# Patient Record
Sex: Male | Born: 1975
Health system: Southern US, Community
[De-identification: ages and names within clinical notes are randomized; demographics above are authoritative.]

## PROBLEM LIST (undated history)

## (undated) DIAGNOSIS — F329 Major depressive disorder, single episode, unspecified: Secondary | ICD-10-CM

## (undated) DIAGNOSIS — F32A Depression, unspecified: Secondary | ICD-10-CM

## (undated) HISTORY — DX: Depression, unspecified: F32.A

## (undated) HISTORY — DX: Major depressive disorder, single episode, unspecified: F32.9

---

## 2010-10-31 DIAGNOSIS — F411 Generalized anxiety disorder: Secondary | ICD-10-CM | POA: Insufficient documentation

## 2014-06-07 ENCOUNTER — Ambulatory Visit (INDEPENDENT_AMBULATORY_CARE_PROVIDER_SITE_OTHER): Payer: 59 | Admitting: Physician Assistant

## 2014-06-07 VITALS — BP 158/90 | HR 122 | Temp 98.6°F | Resp 20 | Ht 72.0 in | Wt 213.8 lb

## 2014-06-07 DIAGNOSIS — IMO0001 Reserved for inherently not codable concepts without codable children: Secondary | ICD-10-CM

## 2014-06-07 DIAGNOSIS — R030 Elevated blood-pressure reading, without diagnosis of hypertension: Secondary | ICD-10-CM

## 2014-06-07 DIAGNOSIS — F329 Major depressive disorder, single episode, unspecified: Secondary | ICD-10-CM

## 2014-06-07 DIAGNOSIS — F32A Depression, unspecified: Secondary | ICD-10-CM

## 2014-06-07 MED ORDER — ESCITALOPRAM OXALATE 10 MG PO TABS: 10.0000 mg | ORAL_TABLET | Freq: Every day | ORAL | Status: DC

## 2014-06-07 NOTE — Progress Notes (Signed)
   Subjective:    Patient ID: Justin Graham, male    DOB: 1975/07/28, 39 y.o.   MRN: [id number]  Chief Complaint  Patient presents with  . Establish Care   Patient Active Problem List   Diagnosis Date Noted  . Depression 06/07/2014   Medications, allergies, past medical history, surgical history, family history, social history and problem list reviewed and updated.  HPI  [age] yom with pmh depression presents here to establish care for diabetes management.   Hx depression going back 10 yrs. Had atypical cp episodes around that time and was eventually referred diagnosed with depression and referred to psych. He saw psych for several yrs for counseling and fund this helpful. They started him initially on prozac yrs ago but he had hives with this. He then tried both wellbutrin and effexor which both caused anxiety. He then was off medication for several yrs. During this time he felt back to his baseline of guilt, hopelessness, constant worry, conc issues, and lethargy. He has also experienced these sx every time he has weaned off an anti-depressant.   Prior to starting grad school 3 yrs ago he decided to start tx again. Started celexa. This has worked relatively well decreasing the above mentioned sx. Today he presents as he has run out of his medication and has moved so is no longer seeing his pcp. He works in [employer], [employer] and is concerned about the risk of qt prolongation assoc with celexa.   Denies hx SI/HI.   Review of Systems No cp, palps, fever, chills.     Objective:   Physical Exam  Constitutional: He is oriented to person, place, and time. He appears well-developed and well-nourished.  Non-toxic appearance. He does not have a sickly appearance. He does not appear ill. No distress.  BP 158/90 mmHg  Pulse 122  Temp(Src) 98.6 F (37 C) (Oral)  Resp 20  Ht 6' (1.829 m)  Wt 213 lb 12.8 oz (96.979 kg)  BMI 28.99 kg/m2  SpO2 99%   Eyes: Conjunctivae and EOM are normal.    Neurological: He is alert and oriented to person, place, and time.  Psychiatric: He has a normal mood and affect. His speech is normal and behavior is normal.      Assessment & Plan:   [age] yom with pmh depression presents here to establish care for diabetes management.   Depression --we will try lexapro today as this theoretically has less risk qt prolongation which pt is concerned over --start 10 mg qd today, he will check back in 4-6 wks with phone call or ov to assess how its working --potential to titrate up at that point  Elevated BP --pt states he has cuff at home and normally runs 130-140s/80s at home --encouraged to check 2x/week at home and record readings --rtc if running >140/90  Donnajean Lopesodd M. Falan Hensler, PA-C Physician Assistant-Certified [facility]  06/07/2014 11:46 AM

## 2014-10-31 ENCOUNTER — Telehealth: Payer: Self-pay | Admitting: Urgent Care

## 2014-10-31 DIAGNOSIS — I10 Essential (primary) hypertension: Secondary | ICD-10-CM

## 2014-10-31 MED ORDER — LISINOPRIL 5 MG PO TABS: 5.0000 mg | ORAL_TABLET | Freq: Every day | ORAL | Status: DC

## 2014-10-31 NOTE — Telephone Encounter (Signed)
Patient called regarding his blood pressure, he has been checking it over the past month and has consistently been in the 150 systolic throughout the day. Patient is asymptomatic. He uses snus and reports that this may be the cause of his symptoms. He is planning on stopping this but would like to start lis  qd. I will send script to his pharmacy and have him follow up in 1 month.

## 2014-11-21 ENCOUNTER — Other Ambulatory Visit: Payer: Self-pay | Admitting: Urgent Care

## 2014-11-21 MED ORDER — AMOXICILLIN 875 MG PO TABS: 875.0000 mg | ORAL_TABLET | Freq: Two times a day (BID) | ORAL | Status: DC

## 2015-02-15 ENCOUNTER — Other Ambulatory Visit: Payer: Self-pay | Admitting: Urgent Care

## 2015-02-15 MED ORDER — ESCITALOPRAM OXALATE 10 MG PO TABS: 10.0000 mg | ORAL_TABLET | Freq: Every day | ORAL | Status: DC

## 2015-02-15 MED ORDER — LISINOPRIL 10 MG PO TABS: 10.0000 mg | ORAL_TABLET | Freq: Every day | ORAL | Status: DC

## 2015-02-15 NOTE — Progress Notes (Signed)
Patient reports need for refills on his BP and anxiety/depression medications. He is doing well with lisinopril 10mg , lexapro 10mg . Plans on physical exam in the near future.  Wallis BambergMario Kjersti Dittmer, PA-C [facility] [phone] 02/15/2015  4:57 PM

## 2015-06-05 ENCOUNTER — Other Ambulatory Visit (INDEPENDENT_AMBULATORY_CARE_PROVIDER_SITE_OTHER): Payer: 59 | Admitting: Physician Assistant

## 2015-06-05 DIAGNOSIS — J101 Influenza due to other identified influenza virus with other respiratory manifestations: Secondary | ICD-10-CM

## 2015-06-05 LAB — POCT INFLUENZA A/B
INFLUENZA A, POC: POSITIVE — AB
INFLUENZA B, POC: NEGATIVE

## 2015-06-05 MED ORDER — OSELTAMIVIR PHOSPHATE 75 MG PO CAPS: 75.0000 mg | ORAL_CAPSULE | Freq: Two times a day (BID) | ORAL | Status: AC

## 2015-06-05 NOTE — Progress Notes (Signed)
Pt was at work with cold flu-like symptoms.  Flu test was run and it was positive.

## 2015-07-03 ENCOUNTER — Other Ambulatory Visit: Payer: Self-pay | Admitting: Physician Assistant

## 2015-09-10 ENCOUNTER — Encounter: Payer: Self-pay | Admitting: Emergency Medicine

## 2015-09-10 ENCOUNTER — Ambulatory Visit (INDEPENDENT_AMBULATORY_CARE_PROVIDER_SITE_OTHER): Payer: 59

## 2015-09-10 ENCOUNTER — Ambulatory Visit (INDEPENDENT_AMBULATORY_CARE_PROVIDER_SITE_OTHER): Payer: 59 | Admitting: Emergency Medicine

## 2015-09-10 VITALS — BP 136/84 | HR 82 | Temp 98.3°F | Resp 18 | Ht 72.0 in

## 2015-09-10 DIAGNOSIS — F329 Major depressive disorder, single episode, unspecified: Secondary | ICD-10-CM | POA: Diagnosis not present

## 2015-09-10 DIAGNOSIS — R0789 Other chest pain: Secondary | ICD-10-CM

## 2015-09-10 DIAGNOSIS — K219 Gastro-esophageal reflux disease without esophagitis: Secondary | ICD-10-CM

## 2015-09-10 DIAGNOSIS — F32A Depression, unspecified: Secondary | ICD-10-CM

## 2015-09-10 LAB — COMPLETE METABOLIC PANEL WITH GFR
ALT: 31 U/L (ref 9–46)
AST: 28 U/L (ref 10–40)
Albumin: 4.8 g/dL (ref 3.6–5.1)
Alkaline Phosphatase: 64 U/L (ref 40–115)
BILIRUBIN TOTAL: 0.5 mg/dL (ref 0.2–1.2)
BUN: 11 mg/dL (ref 7–25)
CO2: 26 mmol/L (ref 20–31)
CREATININE: 1.12 mg/dL (ref 0.60–1.35)
Calcium: 9.6 mg/dL (ref 8.6–10.3)
Chloride: 104 mmol/L (ref 98–110)
GFR, Est Non African American: 83 mL/min (ref >=60)
Glucose, Bld: 86 mg/dL (ref 65–99)
Potassium: 4.4 mmol/L (ref 3.5–5.3)
Sodium: 140 mmol/L (ref 135–146)
TOTAL PROTEIN: 7.4 g/dL (ref 6.1–8.1)

## 2015-09-10 LAB — VITAMIN B12: Vitamin B-12: 681 pg/mL (ref 200–1100)

## 2015-09-10 LAB — LIPID PANEL
CHOL/HDL RATIO: 5 ratio (ref <=5.0)
Cholesterol: 216 mg/dL — ABNORMAL HIGH (ref 125–200)
HDL: 43 mg/dL (ref >=40)
LDL CALC: 154 mg/dL — AB (ref <130)
TRIGLYCERIDES: 96 mg/dL (ref <150)
VLDL: 19 mg/dL (ref <30)

## 2015-09-10 LAB — TROPONIN I

## 2015-09-10 LAB — MAGNESIUM: Magnesium: 2.1 mg/dL (ref 1.5–2.5)

## 2015-09-10 LAB — LIPASE: LIPASE: 33 U/L (ref 7–60)

## 2015-09-10 LAB — TSH: TSH: 1.19 mIU/L (ref 0.40–4.50)

## 2015-09-10 MED ORDER — SUCRALFATE 1 GM/10ML PO SUSP: 1.0000 g | Freq: Three times a day (TID) | ORAL | Status: DC

## 2015-09-10 NOTE — Patient Instructions (Signed)
     IF you received an x-ray today, you will receive an invoice from [facility]. Please contact [facility] at [phone] with questions or concerns regarding your invoice.   IF you received labwork today, you will receive an invoice from [facility]. Please contact [facility] at [phone] with questions or concerns regarding your invoice.   Our billing staff will not be able to assist you with questions regarding bills from these companies.  You will be contacted with the lab results as soon as they are available. The fastest way to get your results is to activate your My Chart account. Instructions are located on the last page of this paperwork. If you have not heard from us regarding the results in 2 weeks, please contact this office.      

## 2015-09-10 NOTE — Progress Notes (Signed)
Patient ID: Justin Graham, male   DOB: [birth year], 40 y.o.   MRN: [id number]    By signing my name below I, Shelah LewandowskyJoseph Thomas, attest that this documentation has been prepared under the direction and in the presence of Lesle ChrisSteven Mairim Bade, MD. Electonically Signed. Shelah LewandowskyJoseph Thomas, Scribe 09/10/2015 at 2:28 PM  Chief Complaint: No chief complaint on file.   HPI: Justin Graham is a 40 y.o. male [facility] complaining of intractable heartburn for the past 4-5 days. Pt has had GERD since childhood. Pt has been on omeprazole 20mg  a day for 4 years and GERD returned if stopped taking omeprazole for 3 days. Pt quit taking omeprazole cold [place] 1.5 months ago. For the past 5 days, pt has had constant burning sensation throughout his epigastric region.  Pt denies any difficulty swallowing food or liquids. Pt denies any hematemesis, dark tarry stool, blood in stool, or changes in his BM.   Pt normally drinks 5-8 cups a coffee a day. Yesterday pt drank no coffee and pt had one cup of coffee today.   Increased stress due to newborn child.   Pt last antibiotic was amoxicillin 6 months ago. Pt denies any chronic medications other than the omeprazole.  Pt currently taking 40mg  omeprazole BID, 1gram carafate four times a day on an empty stomach, and 300mg  ranitidine. Pt states heart burn sensation persists and the carafate provides mild relief.  Past Medical History  Diagnosis Date  . Depression    No past surgical history on file. Social History   Social History  . Marital Status: Married    Spouse Name: N/A  . Number of Children: N/A  . Years of Education: N/A   Social History Main Topics  . Smoking status: Never Smoker   . Smokeless tobacco: Not on file  . Alcohol Use: 3.0 oz/week    0 Standard drinks or equivalent, 5 Cans of beer per week  . Drug Use: No  . Sexual Activity: Not on file   Other Topics Concern  . Not on file   Social History Narrative  . No narrative on file    Family History  Problem Relation Age of Onset  . Cancer Maternal Grandmother   . Stroke Maternal Grandfather   . Stroke Paternal Grandfather    Allergies  Allergen Reactions  . Prozac [Fluoxetine Hcl] Hives   Prior to Admission medications   Medication Sig Start Date End Date Taking? Authorizing Provider  amoxicillin (AMOXIL) 875 MG tablet Take 1 tablet (875 mg total) by mouth 2 (two) times daily. 11/21/14   Wallis BambergMario Mani, PA-C  escitalopram (LEXAPRO) 10 MG tablet Take 1 tablet (10 mg total) by mouth daily. 02/15/15   Wallis BambergMario Mani, PA-C  lisinopril (PRINIVIL,ZESTRIL) 10 MG tablet Take 1 tablet (10 mg total) by mouth daily. 02/15/15   Wallis BambergMario Mani, PA-C     ROS: The patient denies fevers, chills, night sweats, unintentional weight loss, chest pain, palpitations, wheezing, dyspnea on exertion, nausea, vomiting, abdominal pain, dysuria, hematuria, melena, numbness, weakness, or tingling.  All other systems have been reviewed and were otherwise negative with the exception of those mentioned in the HPI and as above.    PHYSICAL EXAM: There were no vitals filed for this visit. There is no weight on file to calculate BMI.   General: Alert, no acute distress HEENT:  Normocephalic, atraumatic, oropharynx patent. Eye: Nonie HoyerOMI, [facility] Cardiovascular:  Regular rate and rhythm, no rubs murmurs or gallops.  No Carotid bruits, radial  pulse intact. No pedal edema.  Respiratory: Clear to auscultation bilaterally.  No wheezes, rales, or rhonchi.  No cyanosis, no use of accessory musculature Abdominal: No organomegaly, abdomen is soft and non-tender, positive bowel sounds.  No masses. Musculoskeletal: Gait intact. No edema, tenderness Skin: No rashes. Neurologic: Facial musculature symmetric. Psychiatric: Patient acts appropriately throughout our interaction. Lymphatic: No cervical or submandibular lymphadenopathy   LABS:    EKG/XRAY:   Primary read interpreted by [facility]. Cleta Alberts at [facility].There are  nonspecific ST-T changes.   ASSESSMENT/PLAN: Patient has 4 days of persistent burning in his chest and upper abdomen. He has been on maximal PPI therapy with omeprazole 40 twice a day, Carafate, and ranitidine. He has had no history of exercise induced chest discomfort. Routine EKG showed inferior lateral ST depression. Labs were done including a stat troponin. Dr. Jacinto Halim has been kind enough to agree to evaluate the patient..I personally performed the services described in this documentation, which was scribed in my presence. The recorded information has been reviewed and is accurate.    Gross sideeffects, risk and benefits, and alternatives of medications d/w patient. Patient is aware that all medications have potential sideeffects and we are unable to predict every sideeffect or drug-drug interaction that may occur.  Lesle Chris MD 09/10/2015 2:12 PM

## 2015-09-11 ENCOUNTER — Other Ambulatory Visit: Payer: Self-pay | Admitting: Emergency Medicine

## 2015-09-11 ENCOUNTER — Encounter: Payer: Self-pay | Admitting: *Deleted

## 2015-09-11 LAB — VITAMIN D 25 HYDROXY (VIT D DEFICIENCY, FRACTURES): Vit D, 25-Hydroxy: 28 ng/mL — ABNORMAL LOW (ref 30–100)

## 2015-09-11 MED ORDER — ATORVASTATIN CALCIUM 40 MG PO TABS: 40.0000 mg | ORAL_TABLET | Freq: Every day | ORAL | Status: DC

## 2015-09-17 ENCOUNTER — Other Ambulatory Visit: Payer: Self-pay | Admitting: Physician Assistant

## 2015-09-17 MED ORDER — DEXLANSOPRAZOLE 60 MG PO CPDR: 60.0000 mg | DELAYED_RELEASE_CAPSULE | Freq: Every day | ORAL | Status: DC

## 2015-09-17 NOTE — Progress Notes (Signed)
Pt has been on nexium 40mg  bid for 2 weeks and he is not having resolution of heartburn symptoms.  He is able using Zantac 75mg  qid and carafate

## 2015-09-18 DIAGNOSIS — K219 Gastro-esophageal reflux disease without esophagitis: Secondary | ICD-10-CM | POA: Diagnosis not present

## 2015-09-20 DIAGNOSIS — K210 Gastro-esophageal reflux disease with esophagitis: Secondary | ICD-10-CM | POA: Diagnosis not present

## 2015-09-20 DIAGNOSIS — K2970 Gastritis, unspecified, without bleeding: Secondary | ICD-10-CM | POA: Diagnosis not present

## 2015-09-20 DIAGNOSIS — K208 Other esophagitis: Secondary | ICD-10-CM | POA: Diagnosis not present

## 2015-09-20 DIAGNOSIS — K219 Gastro-esophageal reflux disease without esophagitis: Secondary | ICD-10-CM | POA: Diagnosis not present

## 2015-09-20 DIAGNOSIS — K2940 Chronic atrophic gastritis without bleeding: Secondary | ICD-10-CM | POA: Diagnosis not present

## 2016-02-29 ENCOUNTER — Other Ambulatory Visit: Payer: Self-pay | Admitting: Urgent Care

## 2016-02-29 MED ORDER — ESCITALOPRAM OXALATE 10 MG PO TABS: 10.0000 mg | ORAL_TABLET | Freq: Every day | ORAL | 3 refills | Status: DC

## 2016-03-13 ENCOUNTER — Other Ambulatory Visit: Payer: Self-pay | Admitting: Urgent Care

## 2016-03-13 MED ORDER — OMEPRAZOLE 40 MG PO CPDR: 40.0000 mg | DELAYED_RELEASE_CAPSULE | Freq: Two times a day (BID) | ORAL | 3 refills | Status: DC

## 2016-03-27 ENCOUNTER — Telehealth: Payer: Self-pay

## 2016-03-27 NOTE — Telephone Encounter (Signed)
PA completed on covermymeds for BID dosing of omeprazole. Pending.

## 2016-03-31 NOTE — Telephone Encounter (Addendum)
PA was denied because ins will only cover #1 tab per day. [place] Its possible that [place] will cover the 80 mg of pantoprazole?

## 2016-05-10 ENCOUNTER — Telehealth: Payer: Self-pay | Admitting: Urgent Care

## 2016-05-10 MED ORDER — PANTOPRAZOLE SODIUM 40 MG PO TBEC: 40.0000 mg | DELAYED_RELEASE_TABLET | Freq: Every day | ORAL | 3 refills | Status: DC

## 2016-05-10 MED ORDER — ESCITALOPRAM OXALATE 5 MG PO TABS: 5.0000 mg | ORAL_TABLET | Freq: Every day | ORAL | 3 refills | Status: DC

## 2016-05-10 NOTE — Telephone Encounter (Signed)
Patient is doing well with GERD. Would like to start 40mg  pantoprazole. Also needs refill of Lexapro 5mg .

## 2016-10-27 ENCOUNTER — Other Ambulatory Visit: Payer: Self-pay | Admitting: Urgent Care

## 2016-10-27 MED ORDER — LISINOPRIL 10 MG PO TABS: 10.0000 mg | ORAL_TABLET | Freq: Every day | ORAL | 3 refills | Status: DC

## 2017-02-05 ENCOUNTER — Ambulatory Visit (INDEPENDENT_AMBULATORY_CARE_PROVIDER_SITE_OTHER): Payer: 59 | Admitting: Family Medicine

## 2017-02-05 DIAGNOSIS — Z23 Encounter for immunization: Secondary | ICD-10-CM | POA: Diagnosis not present

## 2017-02-06 NOTE — Progress Notes (Signed)
Nurse visit for immunization. I did not see this patient.

## 2017-02-16 ENCOUNTER — Other Ambulatory Visit: Payer: Self-pay | Admitting: Physician Assistant

## 2017-02-16 DIAGNOSIS — I10 Essential (primary) hypertension: Secondary | ICD-10-CM

## 2017-02-16 MED ORDER — LISINOPRIL 10 MG PO TABS: 10.0000 mg | ORAL_TABLET | Freq: Every day | ORAL | 3 refills | Status: DC

## 2017-02-16 NOTE — Progress Notes (Signed)
Filling to desired pharmacy.  mani prescribed.

## 2017-05-26 ENCOUNTER — Other Ambulatory Visit: Payer: Self-pay | Admitting: Urgent Care

## 2017-08-05 ENCOUNTER — Encounter: Payer: Self-pay | Admitting: Physician Assistant

## 2017-09-11 IMAGING — CR DG CHEST 2V
2 series · 2 of 2 positions shown · non-contrast
Comparison: None in PACs

CLINICAL DATA: Clinical esophagitis, gastroesophageal reflux
disease

EXAM:
CHEST  2 VIEW

[PA]
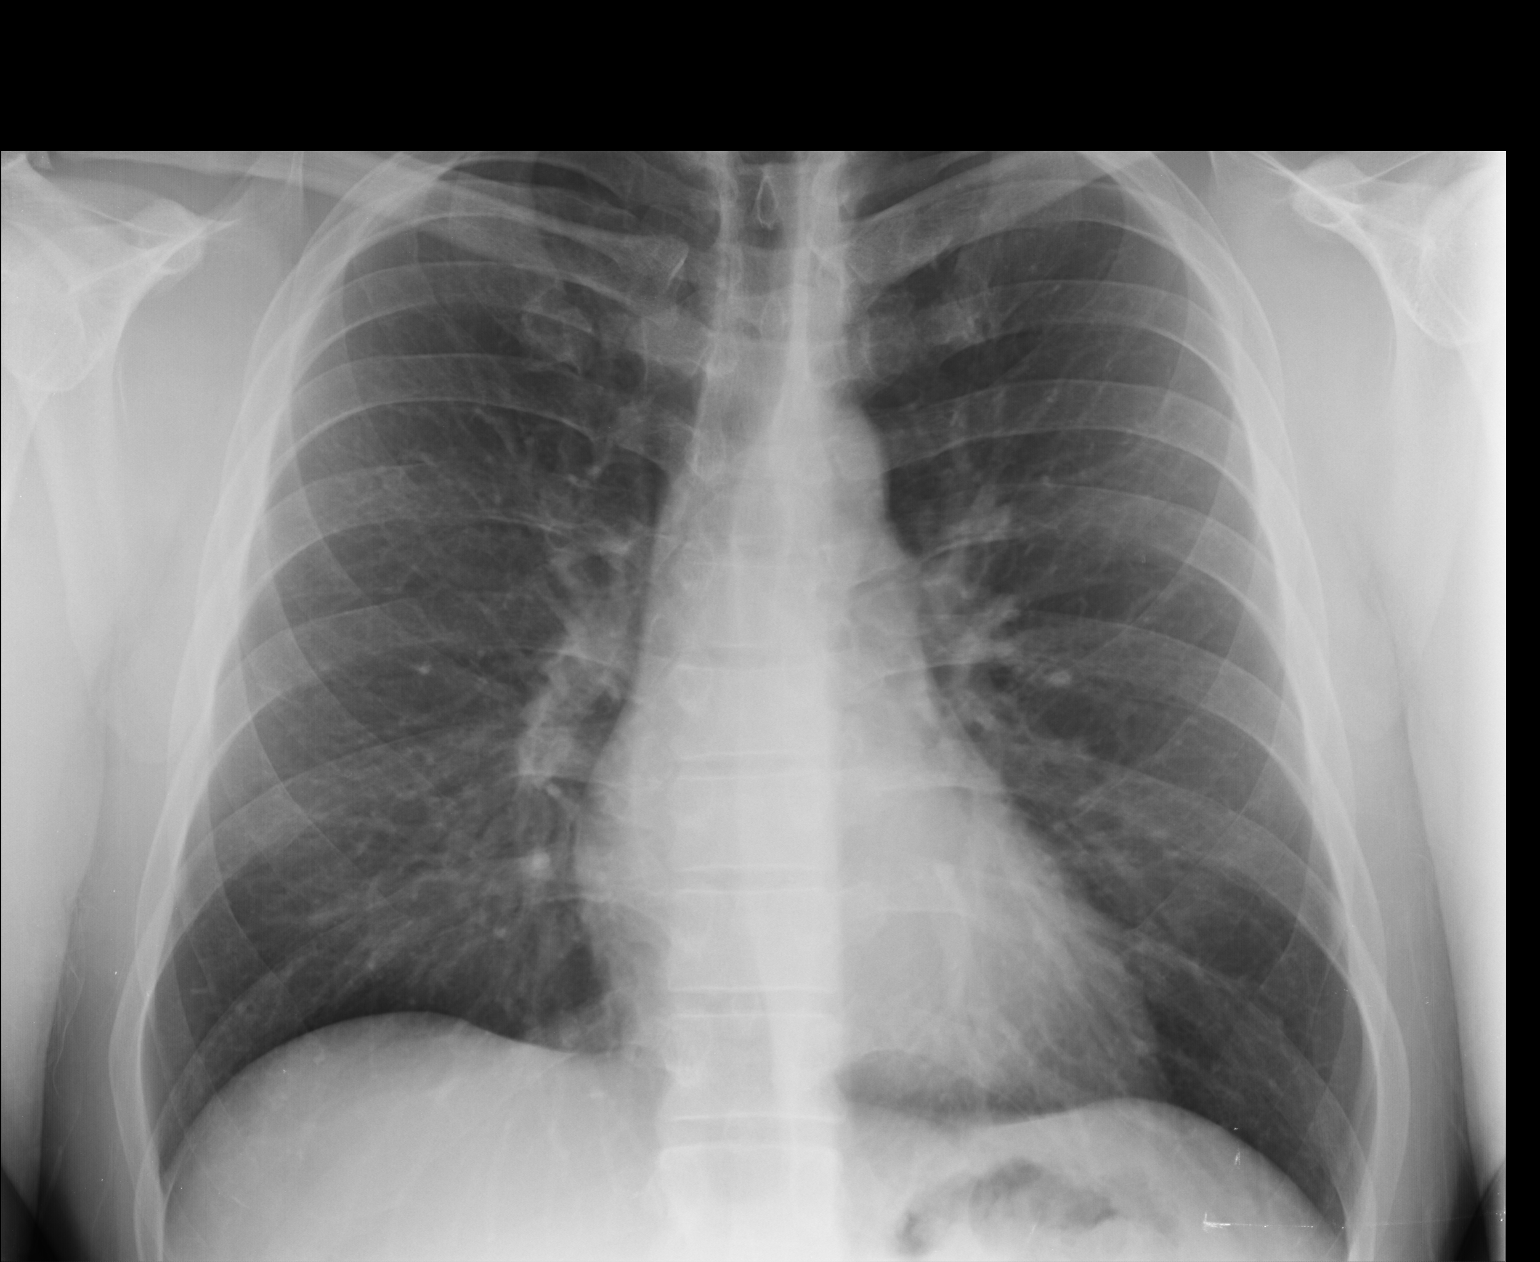

[lateral]
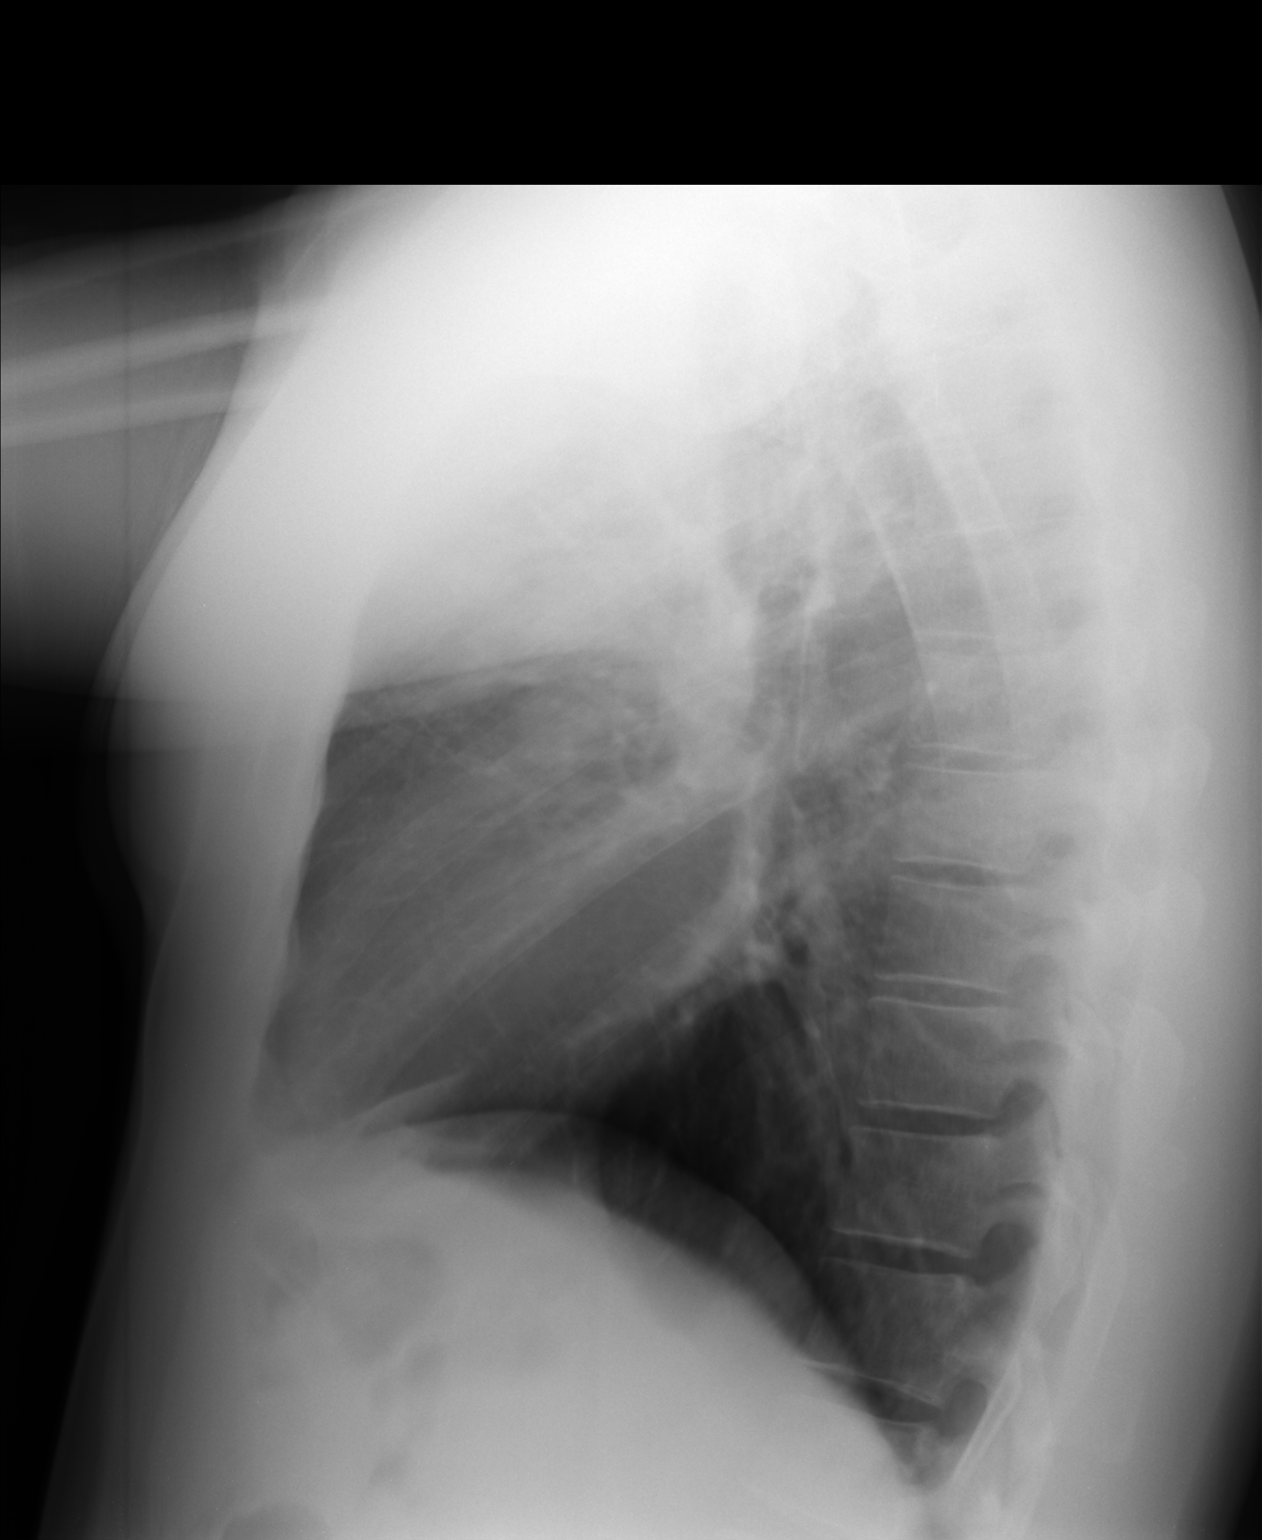

[2 of 2 positions shown; findings below may reference images not displayed]

FINDINGS: The lungs are well-expanded and clear. The heart and mediastinal
structures are normal. There is no pulmonary vascular congestion.
There is no pleural effusion or pneumothorax. The trachea is
midline. The bony thorax is unremarkable.
IMPRESSION: There is no active cardiopulmonary disease. The mediastinal
structures appear normal.

## 2017-11-04 ENCOUNTER — Ambulatory Visit: Payer: 59 | Admitting: Physician Assistant

## 2017-11-04 ENCOUNTER — Other Ambulatory Visit: Payer: Self-pay | Admitting: Physician Assistant

## 2017-11-04 DIAGNOSIS — A184 Tuberculosis of skin and subcutaneous tissue: Secondary | ICD-10-CM

## 2017-11-04 DIAGNOSIS — Z111 Encounter for screening for respiratory tuberculosis: Secondary | ICD-10-CM

## 2017-11-04 NOTE — Progress Notes (Signed)
I did not see this pt. Here for PPD only.

## 2017-11-13 LAB — TB SKIN TEST
Induration: 0 mm
TB Skin Test: NEGATIVE

## 2017-11-17 ENCOUNTER — Encounter: Payer: Self-pay | Admitting: Physician Assistant

## 2017-11-17 ENCOUNTER — Ambulatory Visit (INDEPENDENT_AMBULATORY_CARE_PROVIDER_SITE_OTHER): Payer: 59

## 2017-11-17 ENCOUNTER — Other Ambulatory Visit: Payer: Self-pay

## 2017-11-17 ENCOUNTER — Ambulatory Visit (INDEPENDENT_AMBULATORY_CARE_PROVIDER_SITE_OTHER): Payer: 59 | Admitting: Physician Assistant

## 2017-11-17 VITALS — BP 135/85 | HR 119 | Temp 97.9°F | Resp 18 | Ht 72.0 in | Wt 195.0 lb

## 2017-11-17 DIAGNOSIS — R079 Chest pain, unspecified: Secondary | ICD-10-CM | POA: Diagnosis not present

## 2017-11-17 DIAGNOSIS — I10 Essential (primary) hypertension: Secondary | ICD-10-CM | POA: Diagnosis not present

## 2017-11-17 DIAGNOSIS — F411 Generalized anxiety disorder: Secondary | ICD-10-CM

## 2017-11-17 DIAGNOSIS — E7800 Pure hypercholesterolemia, unspecified: Secondary | ICD-10-CM

## 2017-11-17 DIAGNOSIS — Z23 Encounter for immunization: Secondary | ICD-10-CM

## 2017-11-17 MED ORDER — LISINOPRIL 20 MG PO TABS: 20.0000 mg | ORAL_TABLET | Freq: Every day | ORAL | 1 refills | Status: AC

## 2017-11-17 MED ORDER — BUSPIRONE HCL 10 MG PO TABS: 10.0000 mg | ORAL_TABLET | Freq: Three times a day (TID) | ORAL | 0 refills | Status: DC

## 2017-11-17 MED ORDER — PROPRANOLOL HCL ER 60 MG PO CP24: 60.0000 mg | ORAL_CAPSULE | Freq: Every day | ORAL | 1 refills | Status: DC

## 2017-11-17 NOTE — Progress Notes (Signed)
Justin Graham  MRN: [id number] DOB: 1975/08/10  PCP: Patient, No Pcp Per  Chief Complaint  Patient presents with  . Chest Pain    Subjective:  Pt presents to clinic for concerns regarding chest pain.  This started about 4 days ago it is an aching sensation on his left sternal border.  Seems positional to patient.  Exercise does make the pain get better.  He is having no associated nausea dizziness sweating.  He is worried due to his elevated cholesterol without treatment as well as his diagnosis of hypertension though home readings show under 120/80 most of the time.  He is currently very stressed out and feels very fatigued.  He has had problems with anxiety in the past.  His current concern is what if there is something physically wrong and something happens to release his family behind.  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  He has been on Lexapro in the past which helped his anxiety but he is currently off of it as he has not needed it.  He is changing jobs in 6 weeks and this has increased his anxiety he thinks because he is going to go a month without health insurance.  He is excited about the change though change is anxiety provoking for patient.  His new job he had a on-site positive drug screen for [facility] though he does not use this substance, but does take medications have been known to show up positive for this substance on in-house drug screen.  He is waiting for his send out results to come back.  During this high stress event he actually had no chest pain because he was stressed about something else.    He brings his blood pressure cuff in today which shows multiple blood pressure readings each day.  Patient does admit to excess and over blood pressures when he is like this especially with his current chest pain making sure his blood pressure is fine.  He has seen a cardiologist in the past.  He has had old  EKGs.  Former smoker - 15 pack-year, HTN and elevated cholesterol   History is obtained by patient.  Review of Systems  Cardiovascular: Positive for chest pain and palpitations (Does note elevated heart rate but when he checks his blood pressure at home his heart rate is always been under 100.). Negative for leg swelling.    Patient Active Problem List   Diagnosis Date Noted  . Depression 06/07/2014    Current Outpatient Medications on File Prior to Visit  Medication Sig Dispense Refill  . pantoprazole (PROTONIX) 40 MG tablet TAKE 1 TABLET BY MOUTH DAILY. 90 tablet 3   No current facility-administered medications on file prior to visit.     Allergies  Allergen Reactions  . Fluoxetine Hives  . Bupropion Other (See Comments)    Other Reaction: OTHER REACTION hyperactive/mania.   Sheliah Hatch [Citalopram Hydrobromide] Hives    Past Medical History:  Diagnosis Date  . Depression    Social History   Social History Narrative  . Not on file   Social History   Tobacco Use  . Smoking status: Never Smoker  . Smokeless tobacco: Never Used  Substance Use Topics  . Alcohol use: Yes    Alcohol/week: 3.0 oz    Types: 5 Cans of beer per week  . Drug use: No   family history includes Cancer in  his maternal grandmother; Stroke in his maternal grandfather and paternal grandfather.     Objective:  BP (!) 148/76   Pulse (!) 119   Temp 97.9 F (36.6 C) (Oral)   Resp 18   Ht 6' (1.829 m)   Wt 195 lb (88.5 kg)   SpO2 100%   BMI 26.45 kg/m  Body mass index is 26.45 kg/m.  Wt Readings from Last 3 Encounters:  11/17/17 195 lb (88.5 kg)  06/07/14 213 lb 12.8 oz (97 kg)    Physical Exam  Constitutional: He is oriented to person, place, and time.  HENT:  Head: Normocephalic and atraumatic.  Right Ear: External ear normal.  Left Ear: External ear normal.  Eyes: Conjunctivae are normal.  Neck: Normal range of motion.  Cardiovascular: Normal rate, regular rhythm and normal  heart sounds.  No murmur heard. Pulmonary/Chest: Effort normal and breath sounds normal. He has no wheezes.  No reproducible chest pain in area where patient describes the pain.  No pain with arm strength testing in the area where patient complains of pain.  Neurological: He is alert and oriented to person, place, and time.  Skin: Skin is warm and dry.  Psychiatric: His speech is normal and behavior is normal. Judgment and thought content normal. His mood appears anxious. Cognition and memory are normal. He does not exhibit a depressed mood.     Dg Chest 2 View  Result Date: 11/17/2017 CLINICAL DATA:  Chest pain. EXAM: CHEST - 2 VIEW COMPARISON:  09/10/2015. FINDINGS: Mediastinum and hilar structures normal. Lungs are clear. No pleural effusion or pneumothorax. Degenerative change thoracic spine. IMPRESSION: No acute cardiopulmonary disease. Electronically Signed   By: Marcello Moores  Register   On: 11/17/2017 09:55   Rhythm: sinus rhythm at a rate of 119. Findings: tachycardia  Last EKG: 2017  Changes from last EKG: Yes - program was used to compare and though today is tachycardia the EKG is essentially the same I have personally reviewed the EKG tracing and agree with the computerized printout.  Assessment and Plan :  Chest pain, unspecified type - Plan: DG Chest 2 View, EKG 12-Lead, Troponin I -EKG to local cardiologist which patient has seen before for further comparison and evaluation.  Patient's chest pain from history does not sound cardiovascular especially since it is not worsened with activity and seems to be gone when patient is worried about something else.  Essential hypertension - Plan: lisinopril (PRINIVIL,ZESTRIL) 20 MG tablet, CMP14+EGFR -patient increased his dose of lisinopril his blood pressure seems to be quite good at this level he will continue this at least during the time of his heightened stress/anxiety.  Anxiety state - Plan: propranolol ER (INDERAL LA) 60 MG 24 hr capsule,  busPIRone (BUSPAR) 10 MG tablet -patient would like to try a non-habit-forming controlled substance.  Patient has elevated heart rates I think Inderal will be a good option for him.  I also think that since this is going to be a short-term situation we will try BuSpar versus restarting him on an SSRI.  If this does not help we will consider Klonopin esp at night which is when he is having waking up with anxiety and what almost sounds like panic -BuSpar will be started at 5 mg twice daily increase up to 15 3 times daily as needed patient will communicate with me through my chart.  Elevated LDL cholesterol level - Plan: Lipid panel -check this is not been checked last since 2017 discussed today starting Crestor because  even with diet and exercise changes cholesterol stayed above 150.  Dose will be dependent on results today.  Follow-up will be determined based on the patient's results of medications  Patient verbalized to me that they understand the following: diagnosis, what is being done for them, what to expect and what should be done at home.  Their questions have been answered.  See after visit summary for patient specific instructions.  Windell Hummingbird PA-C  [facility] 11/17/2017 12:24 PM  Please note: Portions of this report may have been transcribed using dragon voice recognition software. Every effort was made to ensure accuracy; however, inadvertent computerized transcription errors may be present.

## 2017-11-17 NOTE — Patient Instructions (Signed)
     IF you received an x-ray today, you will receive an invoice from [facility]. Please contact [facility] at [phone] with questions or concerns regarding your invoice.   IF you received labwork today, you will receive an invoice from [facility]. Please contact [facility] at [phone] with questions or concerns regarding your invoice.   Our billing staff will not be able to assist you with questions regarding bills from these companies.  You will be contacted with the lab results as soon as they are available. The fastest way to get your results is to activate your My Chart account. Instructions are located on the last page of this paperwork. If you have not heard from us regarding the results in 2 weeks, please contact this office.     

## 2017-11-18 LAB — CMP14+EGFR
ALBUMIN: 4.8 g/dL (ref 3.5–5.5)
ALK PHOS: 66 IU/L (ref 39–117)
ALT: 23 IU/L (ref 0–44)
AST: 27 IU/L (ref 0–40)
Albumin/Globulin Ratio: 2 (ref 1.2–2.2)
BUN/Creatinine Ratio: 11 (ref 9–20)
BUN: 13 mg/dL (ref 6–20)
Bilirubin Total: 0.4 mg/dL (ref 0.0–1.2)
CO2: 21 mmol/L (ref 20–29)
CREATININE: 1.23 mg/dL (ref 0.76–1.27)
Calcium: 9.3 mg/dL (ref 8.7–10.2)
Chloride: 101 mmol/L (ref 96–106)
GFR calc non Af Amer: 73 mL/min/{1.73_m2} (ref >59)
GFR, EST AFRICAN AMERICAN: 85 mL/min/{1.73_m2} (ref >59)
GLUCOSE: 101 mg/dL — AB (ref 65–99)
Globulin, Total: 2.4 g/dL (ref 1.5–4.5)
POTASSIUM: 4.2 mmol/L (ref 3.5–5.2)
SODIUM: 139 mmol/L (ref 134–144)
TOTAL PROTEIN: 7.2 g/dL (ref 6.0–8.5)

## 2017-11-18 LAB — LIPID PANEL
CHOL/HDL RATIO: 5.1 ratio — AB (ref 0.0–5.0)
Cholesterol, Total: 200 mg/dL — ABNORMAL HIGH (ref 100–199)
HDL: 39 mg/dL — ABNORMAL LOW (ref >39)
LDL Calculated: 147 mg/dL — ABNORMAL HIGH (ref 0–99)
TRIGLYCERIDES: 70 mg/dL (ref 0–149)
VLDL Cholesterol Cal: 14 mg/dL (ref 5–40)

## 2017-11-18 LAB — TROPONIN I

## 2017-11-19 DIAGNOSIS — I10 Essential (primary) hypertension: Secondary | ICD-10-CM | POA: Insufficient documentation

## 2017-11-19 DIAGNOSIS — E7800 Pure hypercholesterolemia, unspecified: Secondary | ICD-10-CM | POA: Insufficient documentation

## 2017-11-19 MED ORDER — ROSUVASTATIN CALCIUM 5 MG PO TABS
5.0000 mg | ORAL_TABLET | Freq: Every day | ORAL | 3 refills | Status: AC
Start: 2017-11-19 — End: ?

## 2017-11-19 NOTE — Addendum Note (Signed)
Addended by: Morrell RiddleWEBER, SARAH L on: 11/19/2017 07:58 AM   Modules accepted: Orders

## 2017-11-23 ENCOUNTER — Other Ambulatory Visit: Payer: Self-pay | Admitting: Physician Assistant

## 2017-11-23 DIAGNOSIS — Z23 Encounter for immunization: Secondary | ICD-10-CM

## 2017-11-24 ENCOUNTER — Telehealth: Payer: Self-pay | Admitting: Physician Assistant

## 2017-11-24 MED ORDER — ESCITALOPRAM OXALATE 10 MG PO TABS: 10.0000 mg | ORAL_TABLET | Freq: Every day | ORAL | 0 refills | Status: DC

## 2017-11-24 NOTE — Telephone Encounter (Signed)
Symptoms are controlled but the internal feeling is not.  Interested in starting Lexapro.

## 2017-11-26 ENCOUNTER — Other Ambulatory Visit: Payer: Self-pay | Admitting: Physician Assistant

## 2017-11-26 MED ORDER — CLONAZEPAM 0.5 MG PO TABS: 0.5000 mg | ORAL_TABLET | Freq: Every day | ORAL | 0 refills | Status: DC

## 2017-11-26 NOTE — Progress Notes (Signed)
klonopin

## 2017-11-30 DIAGNOSIS — R0789 Other chest pain: Secondary | ICD-10-CM | POA: Diagnosis not present

## 2017-12-25 ENCOUNTER — Ambulatory Visit (INDEPENDENT_AMBULATORY_CARE_PROVIDER_SITE_OTHER): Payer: Self-pay | Admitting: Physician Assistant

## 2017-12-25 ENCOUNTER — Other Ambulatory Visit: Payer: Self-pay | Admitting: Physician Assistant

## 2017-12-25 DIAGNOSIS — Z23 Encounter for immunization: Secondary | ICD-10-CM

## 2017-12-25 MED ORDER — TRAZODONE HCL 50 MG PO TABS: 25.0000 mg | ORAL_TABLET | Freq: Every evening | ORAL | 0 refills | Status: DC | PRN

## 2017-12-25 NOTE — Patient Instructions (Signed)

## 2017-12-25 NOTE — Progress Notes (Signed)
Pt presents here today for visit to receive Influenza vaccine. Allergies reviewed, vaccine given, vaccine information statement provided, tolerated well.   Karl Knarr, RMA Registered Medical Assistant [facility] [phone] 

## 2017-12-25 NOTE — Progress Notes (Signed)
Pt is doing much better with his anxiety - he has stopped the propanolol and buspar and is just taking the Lexparo and his daytime anxiety is well controlled- he is still not sleeping - we will try trazodone - he has been on klonopin but it is not maintaining his sleep

## 2018-01-12 ENCOUNTER — Other Ambulatory Visit: Payer: Self-pay | Admitting: Physician Assistant

## 2018-01-12 MED ORDER — AMITRIPTYLINE HCL 10 MG PO TABS: 10.0000 mg | ORAL_TABLET | Freq: Every day | ORAL | 0 refills | Status: DC

## 2018-01-12 NOTE — Progress Notes (Signed)
Spoke with patient who is doing better with his anxiety levels.  His new job is going well.  Trazodone is giving him side effects that he does not like, he stopped it side effects resolved.  He does not like taking the Klonopin as he only gets about 4 to 6 hours of good sleep and he wakes up about 6 hours after he takes it and can get back to sleep.  He would like to try something else.  He does not want to stay on the Klonopin.  Discussed with patient Elavil and titration up to 50 to 100 mg at night as needed for sleep.

## 2018-02-18 ENCOUNTER — Other Ambulatory Visit: Payer: Self-pay | Admitting: Family Medicine

## 2018-02-18 NOTE — Telephone Encounter (Signed)
Requested medication (s) are due for refill today: yes  Requested medication (s) are on the active medication list: yes  Last refill:  11/24/17  Future visit scheduled: no- pt had office visit addressing issue 11/17/17  Notes to clinic:  Has not established with another provider   Requested Prescriptions  Pending Prescriptions Disp Refills   escitalopram (LEXAPRO) 10 MG tablet 90 tablet 0    Sig: Take 1 tablet (10 mg total) by mouth daily.     Psychiatry:  Antidepressants - SSRI Failed - 02/18/2018  3:11 PM      Failed - Completed PHQ-2 or PHQ-9 in the last 360 days.      Failed - Valid encounter within last 6 months    Recent Outpatient Visits          3 months ago Chest pain, unspecified type   [facility], Dema Severin, PA-C   1 year ago Need for Tdap vaccination   [facility], Meda Coffee, MD   2 years ago Gastroesophageal reflux disease, esophagitis presence not specified   [facility], [facility]   3 years ago Depression   Primary Care at [facility], [place], [place]

## 2018-02-18 NOTE — Telephone Encounter (Signed)
Copied from CRM [id number]. Topic: Quick Communication - See Telephone Encounter >> Feb 18, 2018  3:07 PM Trula Slade wrote: CRM for notification. See Telephone encounter for: 02/18/18. Patient would like a refill on his escitalopram (LEXAPRO) 10 MG tablet medication and have it sent to his preferred pharmacy [facility] outpatient pharmacy.

## 2018-02-19 NOTE — Telephone Encounter (Signed)
Pt message sent to Dr .Leretha Pol

## 2018-02-20 MED ORDER — ESCITALOPRAM OXALATE 10 MG PO TABS: 10.0000 mg | ORAL_TABLET | Freq: Every day | ORAL | 0 refills | Status: AC

## 2018-02-20 NOTE — Telephone Encounter (Signed)
Please schedule patient to establish care as patient's previous pcp in longer with this practice. thanks 

## 2018-05-18 ENCOUNTER — Other Ambulatory Visit: Payer: Self-pay | Admitting: Physician Assistant

## 2019-11-19 IMAGING — DX DG CHEST 2V
3 series · 3 of 3 positions shown · non-contrast
Comparison: 09/10/2015.

CLINICAL DATA: Chest pain.

EXAM:
CHEST - 2 VIEW

[chest pa (1 of 2)]
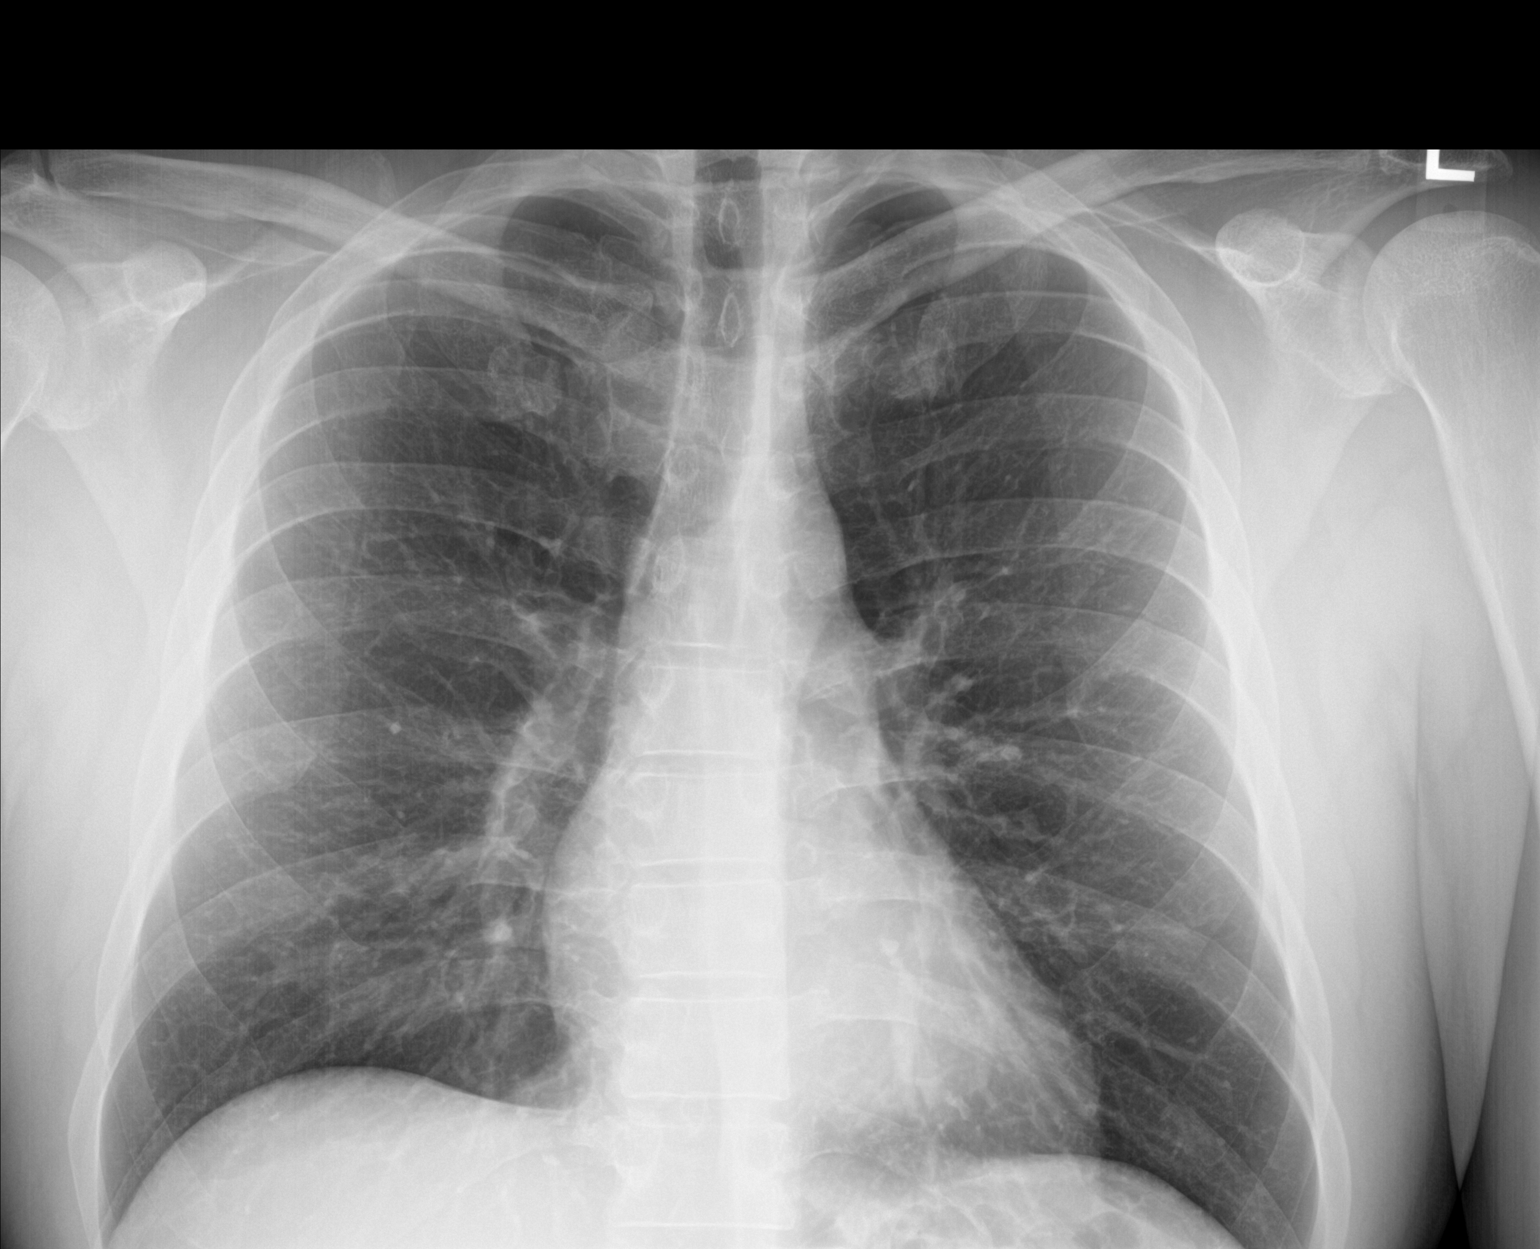

[chest lat]
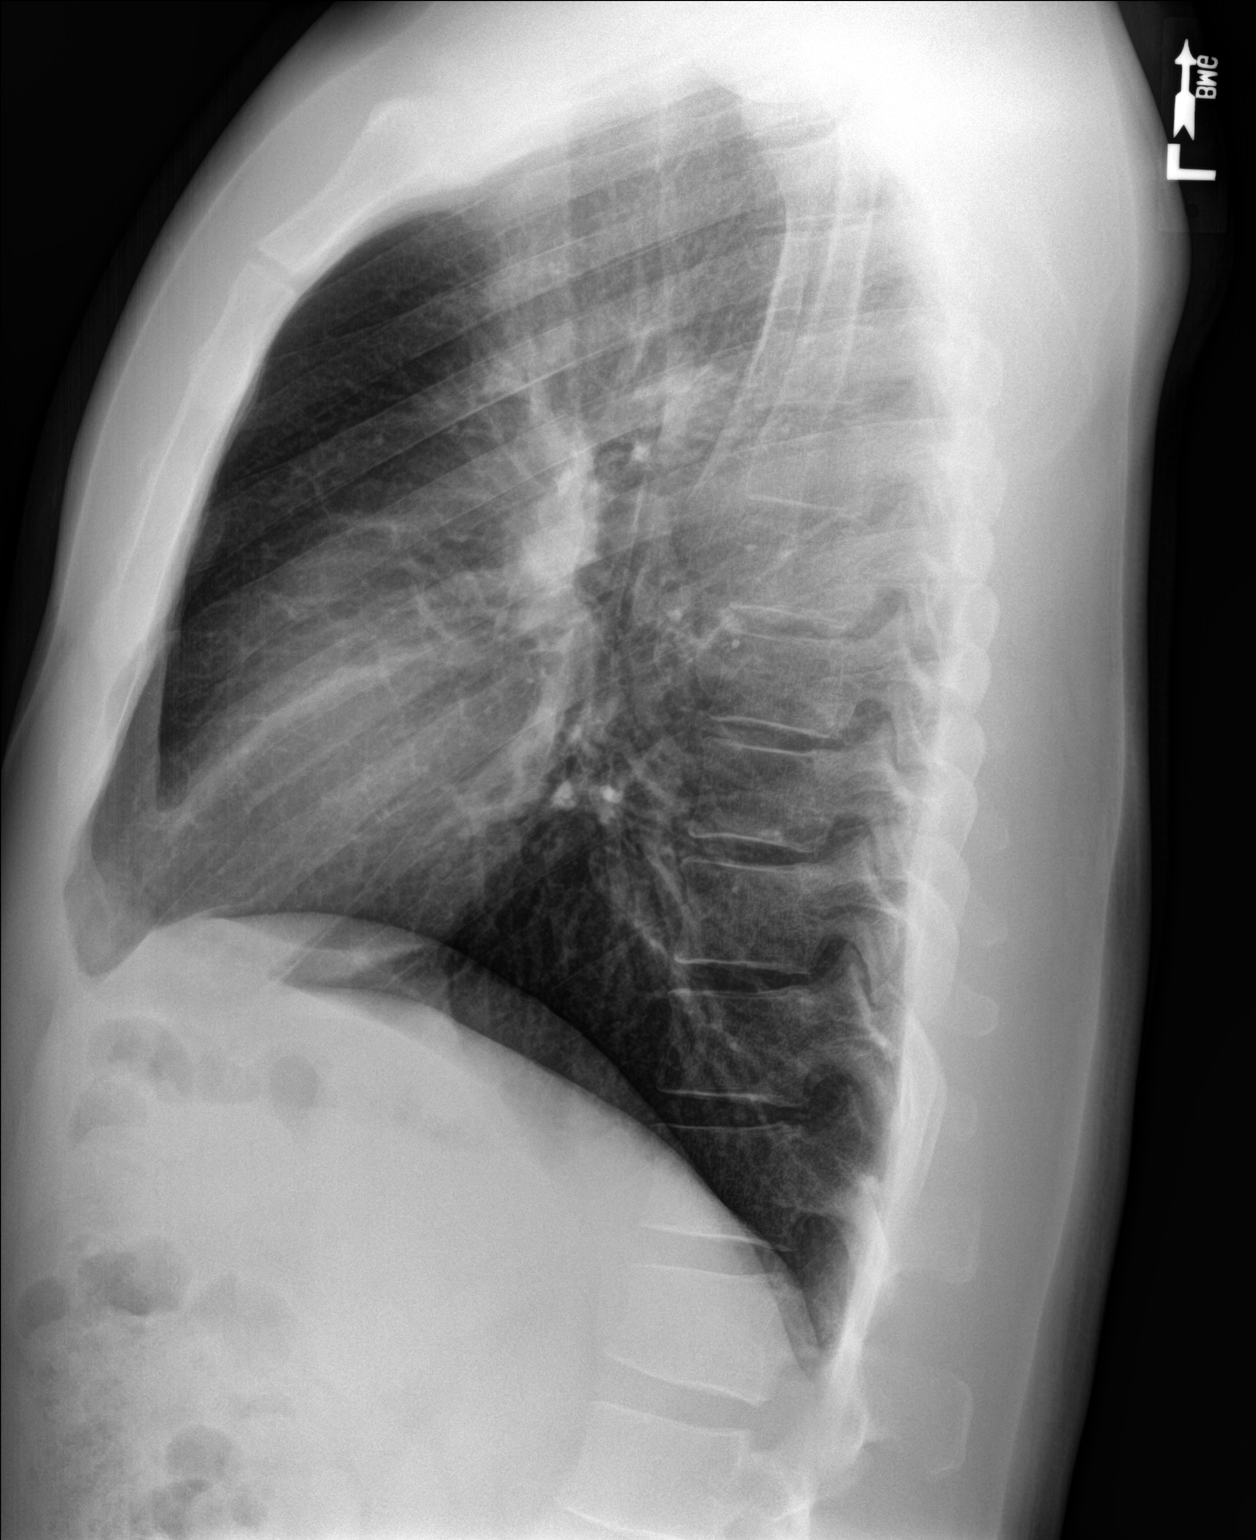

[chest pa (2 of 2)]
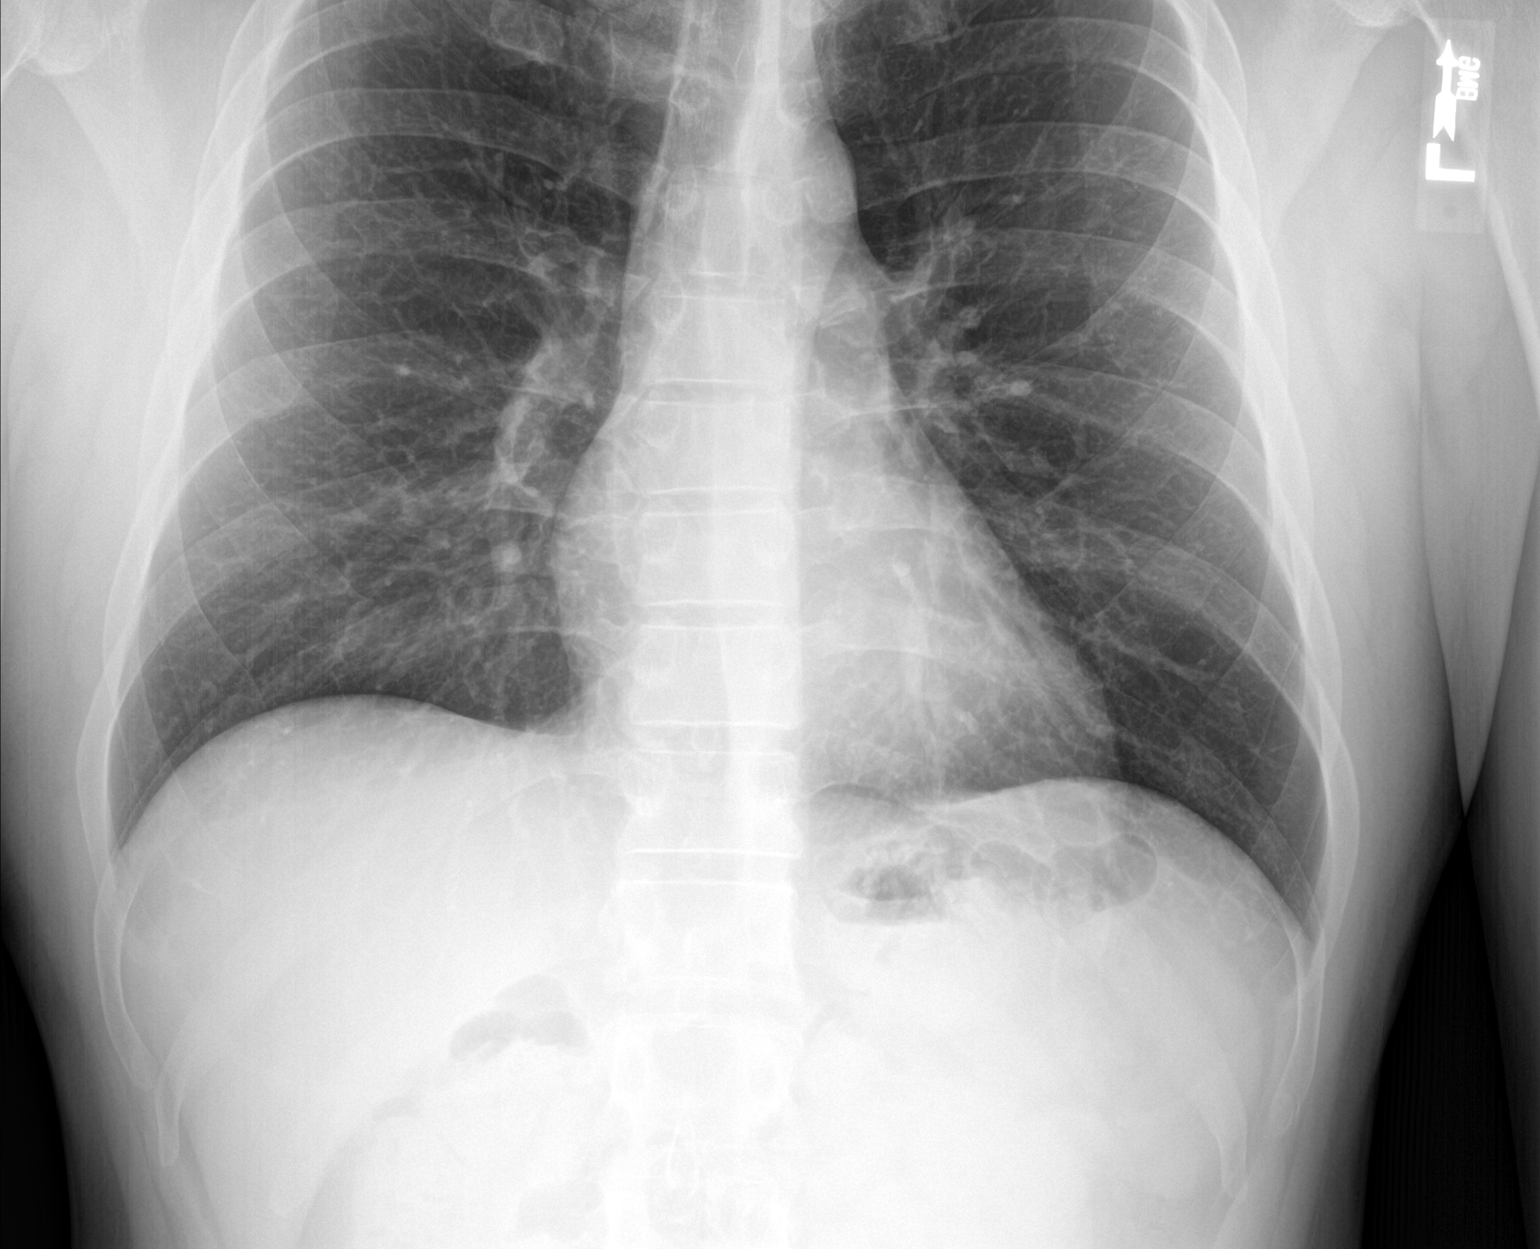

[3 of 3 positions shown; findings below may reference images not displayed]

FINDINGS: Mediastinum and hilar structures normal. Lungs are clear. No pleural
effusion or pneumothorax. Degenerative change thoracic spine.
IMPRESSION: No acute cardiopulmonary disease.
